# Patient Record
Sex: Male | Born: 1995 | Hispanic: Yes | Marital: Single | Smoking: Never smoker
Health system: Southern US, Community
[De-identification: ages and names within clinical notes are randomized; demographics above are authoritative.]

## PROBLEM LIST (undated history)

## (undated) HISTORY — PX: ABDOMINAL SURGERY: SHX537

---

## 2020-06-02 ENCOUNTER — Other Ambulatory Visit: Payer: Self-pay

## 2020-06-02 ENCOUNTER — Encounter (HOSPITAL_COMMUNITY): Payer: Self-pay

## 2020-06-02 ENCOUNTER — Emergency Department (HOSPITAL_COMMUNITY)
Admission: EM | Admit: 2020-06-02 | Discharge: 2020-06-02 | Disposition: A | Discharge status: Home / Self Care | Payer: Self-pay | Attending: Emergency Medicine | Admitting: Emergency Medicine

## 2020-06-02 DIAGNOSIS — Z472 Encounter for removal of internal fixation device: Secondary | ICD-10-CM | POA: Diagnosis not present

## 2020-06-02 DIAGNOSIS — Z5321 Procedure and treatment not carried out due to patient leaving prior to being seen by health care provider: Secondary | ICD-10-CM | POA: Insufficient documentation

## 2020-06-02 NOTE — ED Triage Notes (Signed)
Patient to ED requesting pins be removed from previous surgical tib/ fib fracture. Patient states that he did not follow-up with ortho as scheduled

## 2020-06-05 ENCOUNTER — Encounter (HOSPITAL_COMMUNITY): Payer: Self-pay | Admitting: Orthopedic Surgery

## 2020-06-05 NOTE — Progress Notes (Addendum)
I spoke with Mr. Bill Page using [address] [place], [place] #109323. Mr Bill Page denies any s/s of Covid and denies being in Contact with anyone with Covid. Mr. Bill Page will be tested for Covid on 06/06/20, he has directions, I instructed patient that after test, he should go straight home and quarantine with the people who live in the home.  Mr. Bill Page does not have a PCP.

## 2020-06-06 ENCOUNTER — Other Ambulatory Visit (HOSPITAL_COMMUNITY)
Admission: RE | Admit: 2020-06-06 | Discharge: 2020-06-06 | Disposition: A | Discharge status: Home / Self Care | Payer: HRSA Program | Source: Ambulatory Visit | Attending: Orthopedic Surgery | Admitting: Orthopedic Surgery

## 2020-06-06 DIAGNOSIS — Z20822 Contact with and (suspected) exposure to covid-19: Secondary | ICD-10-CM | POA: Insufficient documentation

## 2020-06-06 DIAGNOSIS — Z01812 Encounter for preprocedural laboratory examination: Secondary | ICD-10-CM | POA: Insufficient documentation

## 2020-06-06 LAB — SARS CORONAVIRUS 2 (TAT 6-24 HRS): SARS Coronavirus 2: NEGATIVE
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
## 2020-06-08 ENCOUNTER — Ambulatory Visit (HOSPITAL_COMMUNITY): Payer: Self-pay | Admitting: Certified Registered Nurse Anesthetist

## 2020-06-08 ENCOUNTER — Encounter (HOSPITAL_COMMUNITY): Payer: Self-pay | Admitting: Orthopedic Surgery

## 2020-06-08 ENCOUNTER — Encounter (HOSPITAL_COMMUNITY)
Admission: RE | Disposition: A | Discharge status: Home / Self Care | Payer: Self-pay | Source: Home / Self Care | Attending: Orthopedic Surgery

## 2020-06-08 ENCOUNTER — Other Ambulatory Visit: Payer: Self-pay

## 2020-06-08 ENCOUNTER — Ambulatory Visit (HOSPITAL_COMMUNITY): Payer: Self-pay

## 2020-06-08 ENCOUNTER — Ambulatory Visit (HOSPITAL_COMMUNITY)
Admission: RE | Admit: 2020-06-08 | Discharge: 2020-06-08 | Disposition: A | Discharge status: Home / Self Care | Payer: Self-pay | Attending: Orthopedic Surgery | Admitting: Orthopedic Surgery

## 2020-06-08 DIAGNOSIS — Z419 Encounter for procedure for purposes other than remedying health state, unspecified: Secondary | ICD-10-CM

## 2020-06-08 DIAGNOSIS — Y831 Surgical operation with implant of artificial internal device as the cause of abnormal reaction of the patient, or of later complication, without mention of misadventure at the time of the procedure: Secondary | ICD-10-CM | POA: Insufficient documentation

## 2020-06-08 DIAGNOSIS — M6702 Short Achilles tendon (acquired), left ankle: Secondary | ICD-10-CM | POA: Insufficient documentation

## 2020-06-08 DIAGNOSIS — L97129 Non-pressure chronic ulcer of left thigh with unspecified severity: Secondary | ICD-10-CM | POA: Insufficient documentation

## 2020-06-08 DIAGNOSIS — T8489XA Other specified complication of internal orthopedic prosthetic devices, implants and grafts, initial encounter: Secondary | ICD-10-CM | POA: Insufficient documentation

## 2020-06-08 DIAGNOSIS — M24572 Contracture, left ankle: Secondary | ICD-10-CM | POA: Insufficient documentation

## 2020-06-08 DIAGNOSIS — L97829 Non-pressure chronic ulcer of other part of left lower leg with unspecified severity: Secondary | ICD-10-CM | POA: Insufficient documentation

## 2020-06-08 HISTORY — PX: EXTERNAL FIXATION REMOVAL: SHX5040

## 2020-06-08 LAB — CBC
HCT: 40.7 % (ref 39.0–52.0)
Hemoglobin: 13.3 g/dL (ref 13.0–17.0)
MCH: 28.1 pg (ref 26.0–34.0)
MCHC: 32.7 g/dL (ref 30.0–36.0)
MCV: 85.9 fL (ref 80.0–100.0)
Platelets: 325 10*3/uL (ref 150–400)
RBC: 4.74 MIL/uL (ref 4.22–5.81)
RDW: 12.3 % (ref 11.5–15.5)
WBC: 5.3 10*3/uL (ref 4.0–10.5)
nRBC: 0 % (ref 0.0–0.2)

## 2020-06-08 SURGERY — REMOVAL, EXTERNAL FIXATION DEVICE, LOWER EXTREMITY
Anesthesia: General | Site: Leg Lower | Laterality: Left

## 2020-06-08 MED ORDER — LIDOCAINE 2% (20 MG/ML) 5 ML SYRINGE
INTRAMUSCULAR | Status: DC | PRN
  Administered 2020-06-08: 100 mg via INTRAVENOUS

## 2020-06-08 MED ORDER — POVIDONE-IODINE 10 % EX SWAB: 2.0000 "application " | Freq: Once | CUTANEOUS | Status: DC

## 2020-06-08 MED ORDER — ACETAMINOPHEN 10 MG/ML IV SOLN
INTRAVENOUS | Status: AC
  Filled 2020-06-08: qty 100

## 2020-06-08 MED ORDER — CEFAZOLIN SODIUM-DEXTROSE 2-4 GM/100ML-% IV SOLN
2.0000 g | INTRAVENOUS | Status: AC
  Administered 2020-06-08: 2 g via INTRAVENOUS

## 2020-06-08 MED ORDER — KETOROLAC TROMETHAMINE 10 MG PO TABS: 10.0000 mg | ORAL_TABLET | Freq: Four times a day (QID) | ORAL | 0 refills | Status: AC | PRN

## 2020-06-08 MED ORDER — ROCURONIUM BROMIDE 10 MG/ML (PF) SYRINGE
PREFILLED_SYRINGE | INTRAVENOUS | Status: DC | PRN
  Administered 2020-06-08: 80 mg via INTRAVENOUS

## 2020-06-08 MED ORDER — ACETAMINOPHEN 10 MG/ML IV SOLN
INTRAVENOUS | Status: DC | PRN
  Administered 2020-06-08: 1000 mg via INTRAVENOUS

## 2020-06-08 MED ORDER — LIDOCAINE 2% (20 MG/ML) 5 ML SYRINGE
INTRAMUSCULAR | Status: AC
  Filled 2020-06-08: qty 5

## 2020-06-08 MED ORDER — ONDANSETRON 4 MG PO TBDP: 4.0000 mg | ORAL_TABLET | Freq: Three times a day (TID) | ORAL | 0 refills | Status: AC | PRN

## 2020-06-08 MED ORDER — OXYCODONE HCL 5 MG PO TABS: 5.0000 mg | ORAL_TABLET | Freq: Once | ORAL | Status: DC | PRN

## 2020-06-08 MED ORDER — ONDANSETRON HCL 4 MG/2ML IJ SOLN
INTRAMUSCULAR | Status: AC
  Filled 2020-06-08: qty 2

## 2020-06-08 MED ORDER — SUGAMMADEX SODIUM 200 MG/2ML IV SOLN
INTRAVENOUS | Status: DC | PRN
  Administered 2020-06-08: 180 mg via INTRAVENOUS

## 2020-06-08 MED ORDER — PHENYLEPHRINE 40 MCG/ML (10ML) SYRINGE FOR IV PUSH (FOR BLOOD PRESSURE SUPPORT)
PREFILLED_SYRINGE | INTRAVENOUS | Status: DC | PRN
  Administered 2020-06-08: 80 ug via INTRAVENOUS

## 2020-06-08 MED ORDER — CHLORHEXIDINE GLUCONATE 4 % EX LIQD: 60.0000 mL | Freq: Once | CUTANEOUS | Status: DC

## 2020-06-08 MED ORDER — KETOROLAC TROMETHAMINE 30 MG/ML IJ SOLN
INTRAMUSCULAR | Status: AC
  Filled 2020-06-08: qty 1

## 2020-06-08 MED ORDER — PHENYLEPHRINE 40 MCG/ML (10ML) SYRINGE FOR IV PUSH (FOR BLOOD PRESSURE SUPPORT)
PREFILLED_SYRINGE | INTRAVENOUS | Status: AC
  Filled 2020-06-08: qty 10

## 2020-06-08 MED ORDER — FENTANYL CITRATE (PF) 250 MCG/5ML IJ SOLN
INTRAMUSCULAR | Status: DC | PRN
Start: 2020-06-08 — End: 2020-06-08
  Administered 2020-06-08: 100 ug via INTRAVENOUS
  Administered 2020-06-08 (×2): 50 ug via INTRAVENOUS

## 2020-06-08 MED ORDER — PROMETHAZINE HCL 25 MG/ML IJ SOLN: 6.2500 mg | INTRAMUSCULAR | Status: DC | PRN

## 2020-06-08 MED ORDER — ONDANSETRON HCL 4 MG/2ML IJ SOLN
INTRAMUSCULAR | Status: DC | PRN
  Administered 2020-06-08: 4 mg via INTRAVENOUS

## 2020-06-08 MED ORDER — DEXAMETHASONE SODIUM PHOSPHATE 10 MG/ML IJ SOLN
INTRAMUSCULAR | Status: AC
  Filled 2020-06-08: qty 1

## 2020-06-08 MED ORDER — PROPOFOL 10 MG/ML IV BOLUS
INTRAVENOUS | Status: AC
  Filled 2020-06-08: qty 20

## 2020-06-08 MED ORDER — CEFAZOLIN SODIUM-DEXTROSE 2-4 GM/100ML-% IV SOLN
INTRAVENOUS | Status: AC
  Filled 2020-06-08: qty 100

## 2020-06-08 MED ORDER — LACTATED RINGERS IV SOLN: INTRAVENOUS | Status: DC

## 2020-06-08 MED ORDER — KETOROLAC TROMETHAMINE 30 MG/ML IJ SOLN
INTRAMUSCULAR | Status: DC | PRN
  Administered 2020-06-08: 30 mg via INTRAVENOUS

## 2020-06-08 MED ORDER — ROCURONIUM BROMIDE 10 MG/ML (PF) SYRINGE
PREFILLED_SYRINGE | INTRAVENOUS | Status: AC
  Filled 2020-06-08: qty 10

## 2020-06-08 MED ORDER — MIDAZOLAM HCL 2 MG/2ML IJ SOLN
INTRAMUSCULAR | Status: DC | PRN
  Administered 2020-06-08: 2 mg via INTRAVENOUS

## 2020-06-08 MED ORDER — CHLORHEXIDINE GLUCONATE 0.12 % MT SOLN
OROMUCOSAL | Status: AC
  Administered 2020-06-08: 15 mL
  Filled 2020-06-08: qty 15

## 2020-06-08 MED ORDER — MIDAZOLAM HCL 2 MG/2ML IJ SOLN
INTRAMUSCULAR | Status: AC
  Filled 2020-06-08: qty 2

## 2020-06-08 MED ORDER — DEXAMETHASONE SODIUM PHOSPHATE 10 MG/ML IJ SOLN
INTRAMUSCULAR | Status: DC | PRN
  Administered 2020-06-08: 10 mg via INTRAVENOUS

## 2020-06-08 MED ORDER — 0.9 % SODIUM CHLORIDE (POUR BTL) OPTIME
TOPICAL | Status: DC | PRN
  Administered 2020-06-08: 1000 mL

## 2020-06-08 MED ORDER — PROPOFOL 10 MG/ML IV BOLUS
INTRAVENOUS | Status: DC | PRN
  Administered 2020-06-08: 200 mg via INTRAVENOUS

## 2020-06-08 MED ORDER — FENTANYL CITRATE (PF) 250 MCG/5ML IJ SOLN
INTRAMUSCULAR | Status: AC
  Filled 2020-06-08: qty 5

## 2020-06-08 MED ORDER — OXYCODONE HCL 5 MG/5ML PO SOLN: 5.0000 mg | Freq: Once | ORAL | Status: DC | PRN

## 2020-06-08 MED ORDER — FENTANYL CITRATE (PF) 100 MCG/2ML IJ SOLN
25.0000 ug | INTRAMUSCULAR | Status: DC | PRN
  Administered 2020-06-08: 50 ug via INTRAVENOUS
  Administered 2020-06-08: 25 ug via INTRAVENOUS

## 2020-06-08 MED ORDER — FENTANYL CITRATE (PF) 100 MCG/2ML IJ SOLN
INTRAMUSCULAR | Status: AC
  Filled 2020-06-08: qty 2

## 2020-06-08 SURGICAL SUPPLY — 48 items
BNDG ELASTIC 4X5.8 VLCR STR LF (GAUZE/BANDAGES/DRESSINGS) IMPLANT
BNDG ELASTIC 6X5.8 VLCR STR LF (GAUZE/BANDAGES/DRESSINGS) IMPLANT
BNDG GAUZE ELAST 4 BULKY (GAUZE/BANDAGES/DRESSINGS) IMPLANT
BRUSH SCRUB EZ PLAIN DRY (MISCELLANEOUS) ×6 IMPLANT
COVER SURGICAL LIGHT HANDLE (MISCELLANEOUS) IMPLANT
COVER WAND RF STERILE (DRAPES) IMPLANT
DRAPE C-ARM 42X72 X-RAY (DRAPES) IMPLANT
DRAPE C-ARMOR (DRAPES) IMPLANT
DRAPE U-SHAPE 47X51 STRL (DRAPES) IMPLANT
DRSG ADAPTIC 3X8 NADH LF (GAUZE/BANDAGES/DRESSINGS) IMPLANT
DRSG MEPITEL 4X7.2 (GAUZE/BANDAGES/DRESSINGS) ×3 IMPLANT
ELECT REM PT RETURN 9FT ADLT (ELECTROSURGICAL) ×3
ELECTRODE REM PT RTRN 9FT ADLT (ELECTROSURGICAL) ×2 IMPLANT
GAUZE SPONGE 4X4 12PLY STRL (GAUZE/BANDAGES/DRESSINGS) ×3 IMPLANT
GLOVE BIO SURGEON STRL SZ7.5 (GLOVE) ×3 IMPLANT
GLOVE BIO SURGEON STRL SZ8 (GLOVE) ×3 IMPLANT
GLOVE BIOGEL PI IND STRL 7.5 (GLOVE) ×2 IMPLANT
GLOVE BIOGEL PI IND STRL 8 (GLOVE) ×2 IMPLANT
GLOVE BIOGEL PI INDICATOR 7.5 (GLOVE) ×1
GLOVE BIOGEL PI INDICATOR 8 (GLOVE) ×1
GOWN STRL REUS W/ TWL LRG LVL3 (GOWN DISPOSABLE) ×4 IMPLANT
GOWN STRL REUS W/ TWL XL LVL3 (GOWN DISPOSABLE) ×2 IMPLANT
GOWN STRL REUS W/TWL LRG LVL3 (GOWN DISPOSABLE) ×2
GOWN STRL REUS W/TWL XL LVL3 (GOWN DISPOSABLE) ×1
KIT BASIN OR (CUSTOM PROCEDURE TRAY) ×3 IMPLANT
KIT TURNOVER KIT B (KITS) ×3 IMPLANT
MANIFOLD NEPTUNE II (INSTRUMENTS) IMPLANT
NS IRRIG 1000ML POUR BTL (IV SOLUTION) ×3 IMPLANT
PACK ORTHO EXTREMITY (CUSTOM PROCEDURE TRAY) ×3 IMPLANT
PAD ARMBOARD 7.5X6 YLW CONV (MISCELLANEOUS) ×6 IMPLANT
PADDING CAST ABS 3INX4YD NS (CAST SUPPLIES) ×1
PADDING CAST ABS 4INX4YD NS (CAST SUPPLIES) ×1
PADDING CAST ABS COTTON 3X4 (CAST SUPPLIES) ×2 IMPLANT
PADDING CAST ABS COTTON 4X4 ST (CAST SUPPLIES) ×2 IMPLANT
PADDING CAST COTTON 6X4 STRL (CAST SUPPLIES) IMPLANT
SCOTCHCAST PLUS 2X4 WHITE (CAST SUPPLIES) ×3 IMPLANT
SCOTCHCAST PLUS 3X4 WHITE (CAST SUPPLIES) ×3 IMPLANT
SCOTCHCAST PLUS 4X4 WHITE (CAST SUPPLIES) ×3 IMPLANT
SPONGE LAP 18X18 RF (DISPOSABLE) IMPLANT
STAPLER VISISTAT 35W (STAPLE) IMPLANT
SYR BULB IRRIG 60ML STRL (SYRINGE) ×3 IMPLANT
TAPE CLOTH SURG 4X10 WHT LF (GAUZE/BANDAGES/DRESSINGS) ×3 IMPLANT
TOWEL GREEN STERILE (TOWEL DISPOSABLE) ×6 IMPLANT
TOWEL GREEN STERILE FF (TOWEL DISPOSABLE) ×3 IMPLANT
TUBE CONNECTING 12X1/4 (SUCTIONS) IMPLANT
UNDERPAD 30X36 HEAVY ABSORB (UNDERPADS AND DIAPERS) ×3 IMPLANT
WATER STERILE IRR 1000ML POUR (IV SOLUTION) IMPLANT
YANKAUER SUCT BULB TIP NO VENT (SUCTIONS) IMPLANT

## 2020-06-08 NOTE — Op Note (Addendum)
NAMEDEVARIS, QUIRK MEDICAL RECORD [id number] ACCOUNT [id number] DATE OF BIRTH:[birth year] FACILITY: [facility] LOCATION: [facility] PHYSICIAN:Doneshia Hill H. Gianna Calef, MD  OPERATIVE REPORT  DATE OF PROCEDURE:  06/08/2020  PREOPERATIVE DIAGNOSES:   1.  Retained external fixator, status post open reduction internal fixation of left bicondylar tibial plateau. 2.  Ulcerated pin sites, thigh and leg. 3.  Heel cord contracture, left ankle.  POSTOPERATIVE DIAGNOSES:   1.  Retained external fixator, status post open reduction internal fixation of left bicondylar tibial plateau. 2.  Ulcerated pin sites, thigh and leg. 3.  Heel cord contracture, left ankle.  PROCEDURES: 1.  Removal of external fixator under anesthesia. 2.  Curettage of ulcerated pin sites, thigh and leg, including removal of bone. 3.  Manipulation of left knee under anesthesia increasing range of motion from 10 to 20 up to 0-100 degrees. 4.  Manual application of stress under fluoroscopy, left tibial plateau fracture. 5.  Manipulation under anesthesia for left ankle equinus contracture. 6.  Application of short leg cast, left leg and ankle.  SURGEON:  Myrene Galas, MD  ASSISTANT:  Montez Morita, PA-C  ANESTHESIA:  General.  COMPLICATIONS:  None.  DISPOSITION:  To PACU.  CONDITION:  Stable.  INDICATIONS FOR PROCEDURE:  The patient is a 24 year old male who [place] open [place] approximately 3 [place].  He presented to our office with retained external fixator, heel cord contracture, and equinus  contracture of the ankle.  We discussed with him the risks and benefits of removal of the fixator including the potential for recurrent infection at the pin sites, failure to obtain range of motion, the probable need to perform an Achilles tendon  lengthening in order to reclaim a plantigrade position of the foot, and the possibility of loss of reduction or discovery of an occult nonunion in his  plateau.  The patient acknowledged these risks through an interpreter and provided consent to proceed.  BRIEF SUMMARY OF PROCEDURE:  The patient was taken to the operating room where general anesthesia was induced.  He did receive antibiotics.  The external fixator was then removed on the left.  The knee had essentially no range of motion to gravity an was restricted to 10-20 degrees, but we brought in C-arm, which appeared to show consolidation of the fracture and then in stepwise fashion, increased flexion to 30 degrees, 60 degrees, and 90 degrees, all with gentle application of pressure.  I did bring in C-arm  and evaluated the tibial tubercle specifically in the flexed position and make sure that as I applied this stress that there was no movement of the fracture or fixation.  None was observed.  The knee was taken all the way to 100 degrees and then brought  back into extension.  At that point, we were then able to obtain the remaining degrees of extension increasing it from a flexed position at 10 degrees up to full extension at 0.  Next, we turned attention to the ankle.  Here through a sustained effort,  we were able to extend the ankle past neutral without requirement for Achilles lengthening.  Once we had achieved this position we then proceeded with application of a short leg walking cast.  This required my assistant to hold it in the plantigrade  fully extended position and then a well-padded and reinforced walking cast was applied.  The patient did undergo debridement of the pin sites prior to application of the cast.  This consisted of aggressive chlorhexidine wash and  scrub followed by serial  curettage of the skin layer through the subcutaneous tissue, muscle and then all the way down to the near cortex and also with a small clean curette, passage through the bone tract itself between the cortices for both the femur and tibia.  These were thoroughly irrigated and then  Mepitel and gauze  dressing applied.  The patient was again taken to PACU in stable condition.  Montez Morita, PA-C, was present and assisting throughout as was PA student.  PROGNOSIS:  The patient will be weightbearing as tolerated with unrestricted range of motion of the knee.  We will plan to see him back in 2-1/2 weeks for removal of his cast.  He can shower while protecting the cast with a bag and will require just  brief pain medication given the manipulation.  He is at elevated risk for recurrence, but will not require formal DVT prophylaxis.  CN/NUANCE  D:06/08/2020 T:06/08/2020 JOB:[id number]

## 2020-06-08 NOTE — Transfer of Care (Signed)
Immediate Anesthesia Transfer of Care Note  Patient: Vidyuth Belsito  Procedure(s) Performed: REMOVAL EXTERNAL FIXATION LEFT LEG, DEBRIDEMENT AND CURETTAGE OF PIN SITES AND LEG ULCERS INCLUDING BONE LEFT LEG, MANIPULATION OF LEFT KNEE AND LEFT ANKLE UNDER FLOURO AND ANESTHESIA (Left Leg Lower)  Patient Location: PACU  Anesthesia Type:General  Level of Consciousness: awake and alert   Airway & Oxygen Therapy: Patient Spontanous Breathing and Patient connected to face mask oxygen  Post-op Assessment: Report given to RN and Post -op Vital signs reviewed and stable  Post vital signs: Reviewed and stable  Last Vitals:  Vitals Value Taken Time  BP 148/111 06/08/20 0952  Temp    Pulse 101 06/08/20 0953  Resp 19 06/08/20 0953  SpO2 100 % 06/08/20 0953  Vitals shown include unvalidated device data.  Last Pain:  Vitals:   06/08/20 0705  TempSrc:   PainSc: 3       Patients Stated Pain Goal: 3 (06/08/20 0705)  Complications: No complications documented.

## 2020-06-08 NOTE — Progress Notes (Signed)
Orthopedic Tech Progress Note Patient Details:  Bill Page [birth year] [id number] PACU RN called requesting a CAST SHOE for patient. We don't carry those so I called to [employer] and they are coming to service patient Patient ID: Bill Page, male   DOB: May 31, 1996, 24 y.o.   MRN: [id number]   Bill Page 06/08/2020, 11:40 AM

## 2020-06-08 NOTE — Discharge Instructions (Addendum)
Orthopaedic Trauma Service Discharge instructions   Weightbearing as tolerated left leg with cast shoe on Continue to use crutches Aggressive knee motion Do not rest with pillows under bend of knee  Ok to remove dressing on thigh starting on 06/11/2020 Clean with soap and water. Use 4x4 gauze to cover wound if there is drainage. Once drainage stops it is ok to leave the wound open to air  Follow up with orthopaedics in 10-14 days for cast removal  Call office with questions at [phone]

## 2020-06-08 NOTE — Anesthesia Postprocedure Evaluation (Signed)
Anesthesia Post Note  Patient: Bill Page  Procedure(s) Performed: REMOVAL EXTERNAL FIXATION LEFT LEG, DEBRIDEMENT AND CURETTAGE OF PIN SITES AND LEG ULCERS INCLUDING BONE LEFT LEG, MANIPULATION OF LEFT KNEE AND LEFT ANKLE UNDER FLOURO AND ANESTHESIA (Left Leg Lower)     Patient location during evaluation: PACU Anesthesia Type: General Level of consciousness: awake and alert Pain management: pain level controlled Vital Signs Assessment: post-procedure vital signs reviewed and stable Respiratory status: spontaneous breathing, nonlabored ventilation and respiratory function stable Cardiovascular status: blood pressure returned to baseline and stable Postop Assessment: no apparent nausea or vomiting Anesthetic complications: no   No complications documented.  Last Vitals:  Vitals:   06/08/20 1015 06/08/20 1030  BP: 131/82 131/86  Pulse: 93 95  Resp: (!) 24 (!) 21  Temp:  (!) 36.3 C  SpO2: 100% 100%    Last Pain:  Vitals:   06/08/20 1030  TempSrc:   PainSc: 0-No pain                 Tycen Dockter,W. EDMOND

## 2020-06-08 NOTE — Anesthesia Procedure Notes (Signed)
Procedure Name: Intubation Date/Time: 06/08/2020 8:32 AM Performed by: Bryson Corona, CRNA Pre-anesthesia Checklist: Patient identified, Emergency Drugs available, Suction available and Patient being monitored Patient Re-evaluated:Patient Re-evaluated prior to induction Oxygen Delivery Method: Circle System Utilized Preoxygenation: Pre-oxygenation with 100% oxygen Induction Type: IV induction Ventilation: Mask ventilation without difficulty Laryngoscope Size: Mac and 3 Grade View: Grade I Tube type: Oral Tube size: 7.0 mm Number of attempts: 1 Airway Equipment and Method: Stylet Placement Confirmation: ETT inserted through vocal cords under direct vision,  positive ETCO2 and breath sounds checked- equal and bilateral Secured at: 22 cm Tube secured with: Tape Dental Injury: Teeth and Oropharynx as per pre-operative assessment

## 2020-06-08 NOTE — Brief Op Note (Signed)
06/08/2020  9:56 AM  PATIENT:  Bill Page  24 y.o. male  PROCEDURE:  Procedure(s): REMOVAL EXTERNAL FIXATION LEFT LEG, DEBRIDEMENT AND CURETTAGE OF PIN SITES AND LEG ULCERS INCLUDING BONE LEFT LEG, MANIPULATION OF LEFT KNEE AND LEFT ANKLE UNDER FLOURO AND ANESTHESIA (Left)  SURGEON:  Surgeon(s) and Role:    Myrene Galas, MD - Primary  DICTATION: .Other Dictation: Dictation Number [id number]  PLAN OF CARE: Discharge to home after PACU  PATIENT DISPOSITION:  PACU - hemodynamically stable.   Delay start of Pharmacological VTE agent (>24hrs) due to surgical blood loss or risk of bleeding: no

## 2020-06-08 NOTE — Anesthesia Preprocedure Evaluation (Addendum)
Anesthesia Evaluation  Patient identified by MRN, date of birth, ID band Patient awake    Reviewed: Allergy & Precautions, NPO status , Patient's Chart, lab work & pertinent test results  History of Anesthesia Complications Negative for: history of anesthetic complications  Airway Mallampati: II  TM Distance: >3 FB Neck ROM: Full    Dental  (+) Dental Advisory Given, Teeth Intact   Pulmonary neg pulmonary ROS,    Pulmonary exam normal        Cardiovascular negative cardio ROS Normal cardiovascular exam     Neuro/Psych negative neurological ROS  negative psych ROS   GI/Hepatic negative GI ROS, (+)     substance abuse  marijuana use,   Endo/Other  negative endocrine ROS  Renal/GU negative Renal ROS     Musculoskeletal negative musculoskeletal ROS (+)   Abdominal   Peds  Hematology negative hematology ROS (+)   Anesthesia Other Findings Covid test negative   Reproductive/Obstetrics                            Anesthesia Physical Anesthesia Plan  ASA: II  Anesthesia Plan: General   Post-op Pain Management:    Induction: Intravenous  PONV Risk Score and Plan: 2 and Treatment may vary due to age or medical condition, Ondansetron, Dexamethasone and Midazolam  Airway Management Planned: Oral ETT  Additional Equipment: None  Intra-op Plan:   Post-operative Plan: Extubation in OR  Informed Consent: I have reviewed the patients History and Physical, chart, labs and discussed the procedure including the risks, benefits and alternatives for the proposed anesthesia with the patient or authorized representative who has indicated his/her understanding and acceptance.     Dental advisory given and Interpreter used for interveiw  Plan Discussed with: CRNA and Anesthesiologist  Anesthesia Plan Comments:        Anesthesia Quick Evaluation

## 2020-06-09 ENCOUNTER — Encounter (HOSPITAL_COMMUNITY): Payer: Self-pay | Admitting: Orthopedic Surgery

## 2021-06-15 IMAGING — RF DG KNEE COMPLETE 4+V*L*
1 series · 7 of 7 positions shown · non-contrast
Comparison: None.

CLINICAL DATA: Intraoperative imaging of the left knee.

EXAM:
LEFT KNEE - COMPLETE 4+ VIEW

[Series 1: run · 7 of 7 slices shown]
[im 1/7]
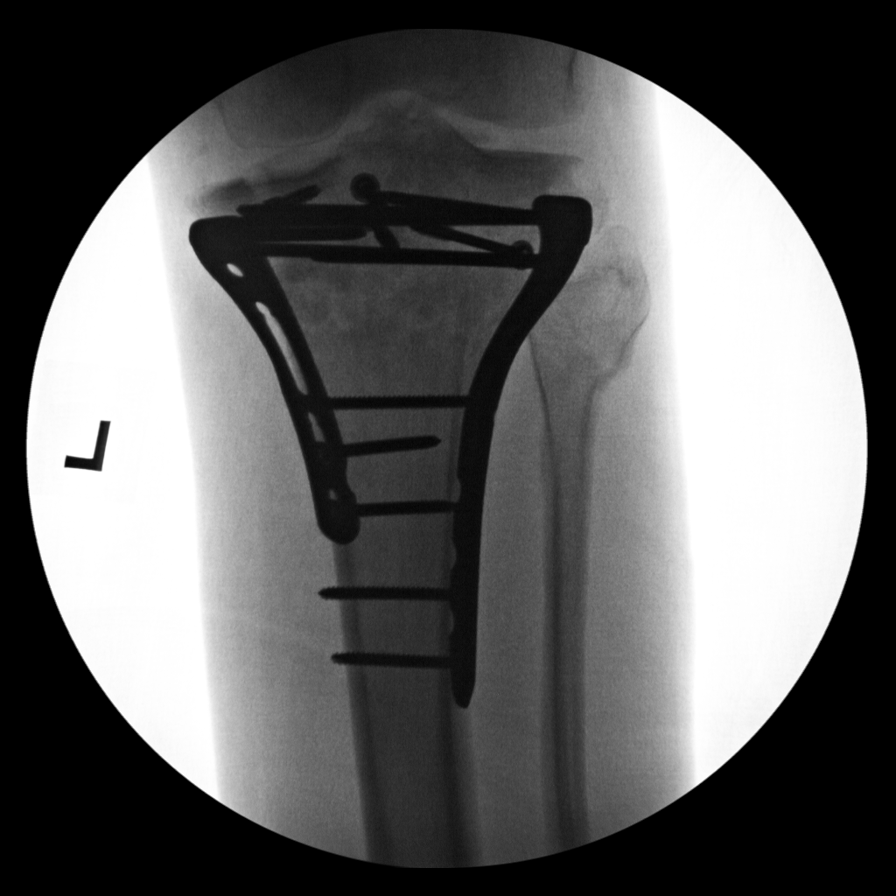
[im 2/7]
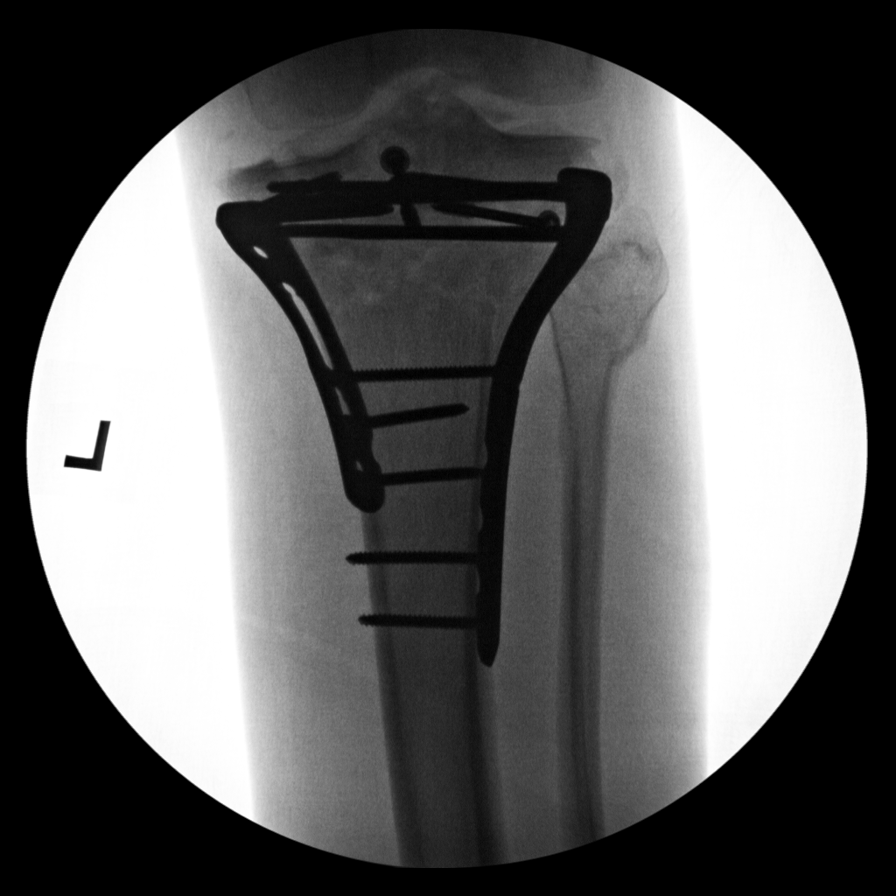
[im 3/7]
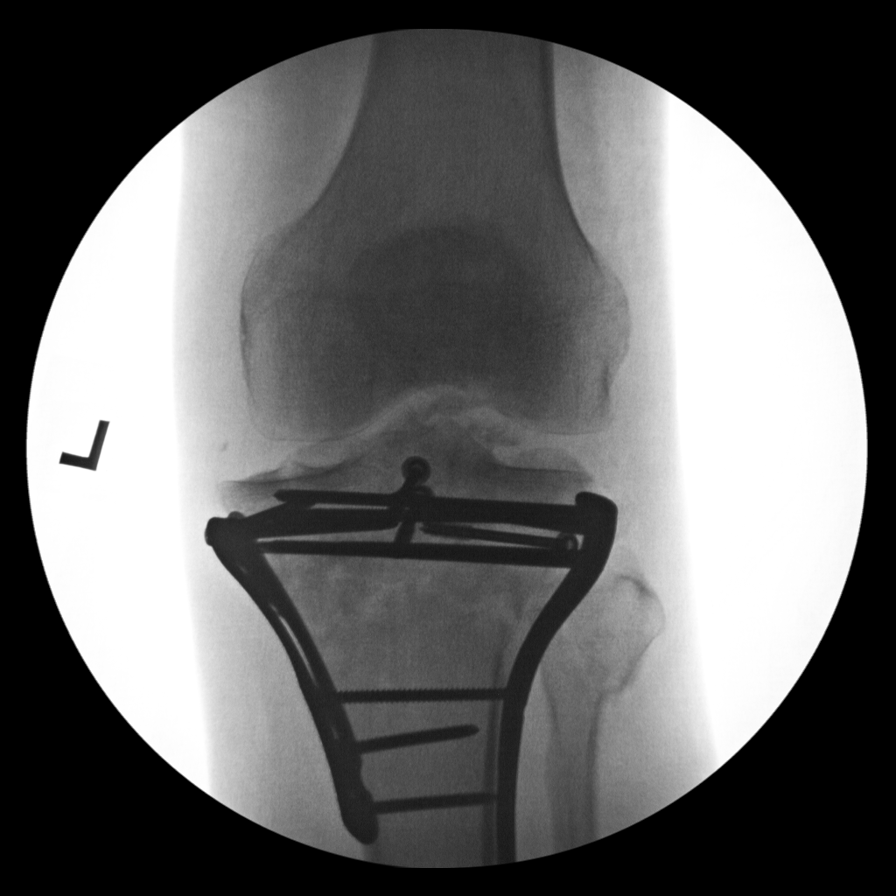
[im 4/7]
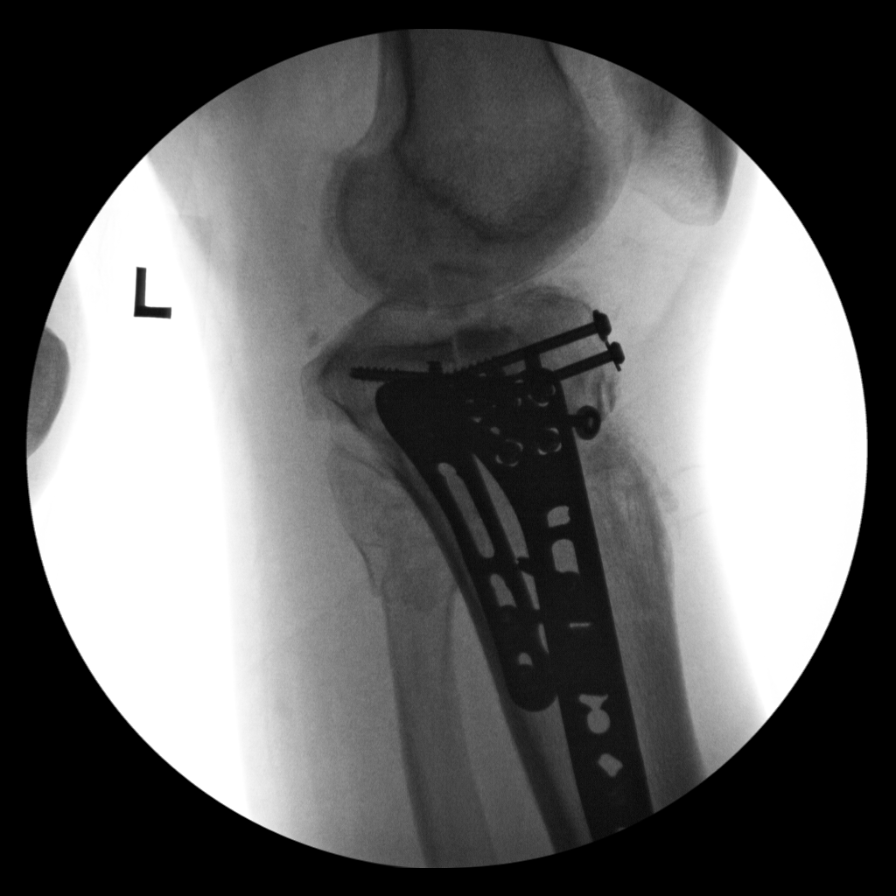
[im 5/7]
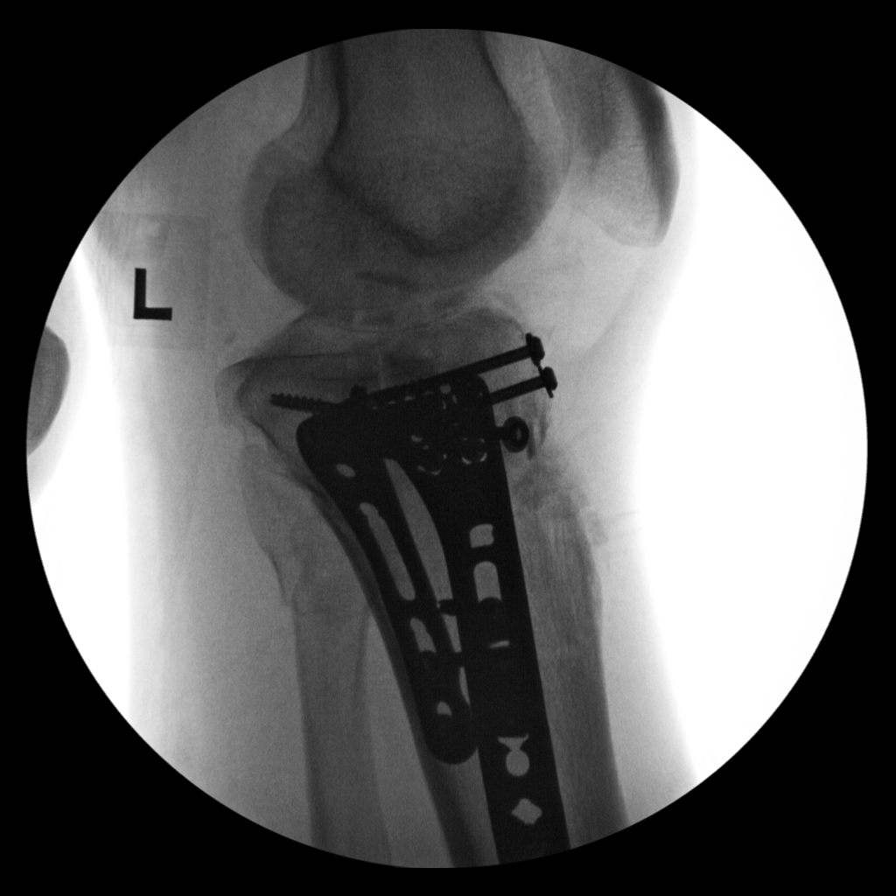
[im 6/7]
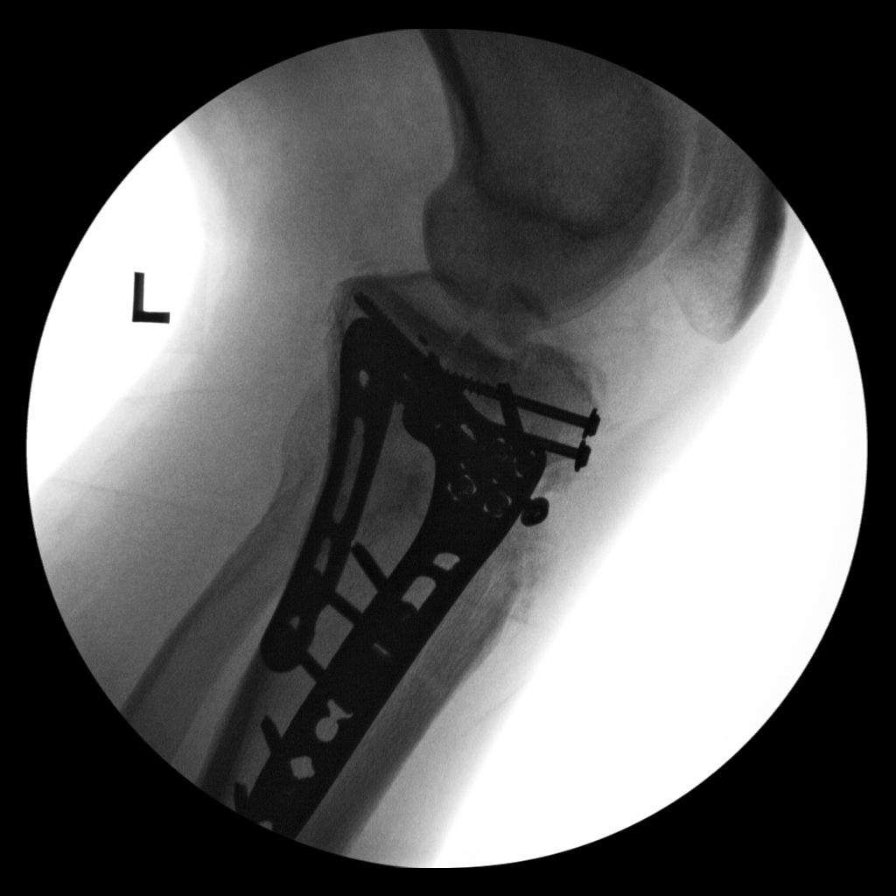
[im 7/7]
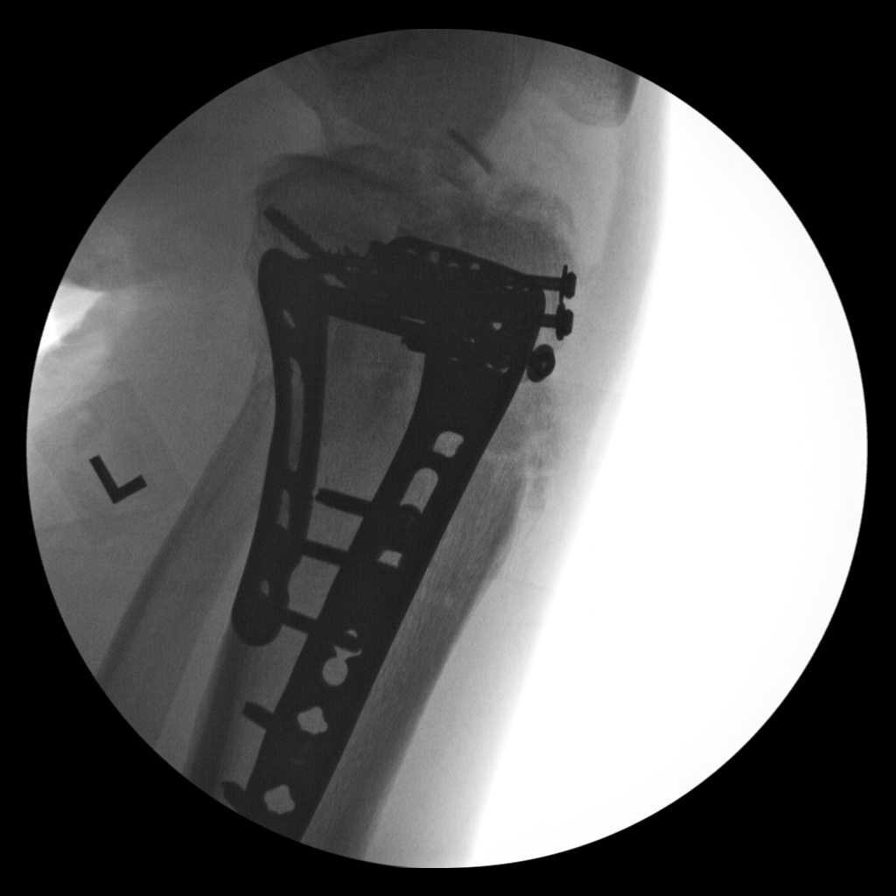

[7 of 7 positions shown; findings below may reference images not displayed]

FINDINGS: Fluoroscopic images of the left knee demonstrate surgical plate and
screws involving the proximal tibia. Deformity of the proximal
fibula is suggestive for an old injury. Lucency and irregularity
along the anterior aspect of the proximal tibia but the bone detail
is limited on these fluoroscopic images.
IMPRESSION: Intraoperative imaging of the left knee and proximal left tibia.
